# Patient Record
Sex: Male | Born: 1995 | Race: Black or African American | Hispanic: No | Marital: Single | Smoking: Never smoker
Health system: Southern US, Community
[De-identification: ages and names within clinical notes are randomized; demographics above are authoritative.]

---
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
## 2013-05-04 ENCOUNTER — Emergency Department (HOSPITAL_COMMUNITY)
Admission: EM | Admit: 2013-05-04 | Discharge: 2013-05-04 | Disposition: A | Discharge status: Home / Self Care | Payer: No Typology Code available for payment source | Attending: Emergency Medicine | Admitting: Emergency Medicine

## 2013-05-04 ENCOUNTER — Emergency Department (HOSPITAL_COMMUNITY): Payer: No Typology Code available for payment source

## 2013-05-04 DIAGNOSIS — T22219A Burn of second degree of unspecified forearm, initial encounter: Secondary | ICD-10-CM | POA: Insufficient documentation

## 2013-05-04 DIAGNOSIS — Y929 Unspecified place or not applicable: Secondary | ICD-10-CM | POA: Insufficient documentation

## 2013-05-04 DIAGNOSIS — X038XXA Other exposure to controlled fire, not in building or structure, initial encounter: Secondary | ICD-10-CM | POA: Insufficient documentation

## 2013-05-04 DIAGNOSIS — T22211A Burn of second degree of right forearm, initial encounter: Secondary | ICD-10-CM

## 2013-05-04 DIAGNOSIS — Y9389 Activity, other specified: Secondary | ICD-10-CM | POA: Insufficient documentation

## 2013-05-04 MED ORDER — SILVER SULFADIAZINE 1 % EX CREA
TOPICAL_CREAM | Freq: Every day | CUTANEOUS | Status: DC
  Administered 2013-05-04: 16:00:00 via TOPICAL
  Filled 2013-05-04: qty 50

## 2013-05-04 NOTE — ED Notes (Signed)
Pt verbalizes understanding 
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
## 2013-05-05 NOTE — ED Provider Notes (Signed)
Medical screening examination/treatment/procedure(s) were performed by non-physician practitioner and as supervising physician I was immediately available for consultation/collaboration.    Koltin Wehmeyer J. Brunilda Eble, MD 05/05/13 1708 

## 2014-06-06 ENCOUNTER — Encounter (HOSPITAL_COMMUNITY): Payer: Self-pay | Admitting: Emergency Medicine

## 2014-06-06 ENCOUNTER — Emergency Department (HOSPITAL_COMMUNITY)
Admission: EM | Admit: 2014-06-06 | Discharge: 2014-06-06 | Disposition: A | Discharge status: Home / Self Care | Payer: Medicaid - Out of State | Attending: Dermatology | Admitting: Dermatology

## 2014-06-06 ENCOUNTER — Emergency Department (HOSPITAL_COMMUNITY): Payer: Medicaid - Out of State

## 2014-06-06 DIAGNOSIS — Y9361 Activity, american tackle football: Secondary | ICD-10-CM | POA: Diagnosis not present

## 2014-06-06 DIAGNOSIS — W2189XA Striking against or struck by other sports equipment, initial encounter: Secondary | ICD-10-CM | POA: Insufficient documentation

## 2014-06-06 DIAGNOSIS — N50811 Right testicular pain: Secondary | ICD-10-CM

## 2014-06-06 DIAGNOSIS — Y92321 Football field as the place of occurrence of the external cause: Secondary | ICD-10-CM | POA: Insufficient documentation

## 2014-06-06 DIAGNOSIS — S3994XA Unspecified injury of external genitals, initial encounter: Secondary | ICD-10-CM | POA: Insufficient documentation

## 2014-06-06 DIAGNOSIS — N451 Epididymitis: Secondary | ICD-10-CM

## 2014-06-06 MED ORDER — NAPROXEN 500 MG PO TABS: 500.0000 mg | ORAL_TABLET | Freq: Two times a day (BID) | ORAL | Status: AC

## 2014-06-06 NOTE — Discharge Instructions (Signed)
Epididymitis °Epididymitis is a swelling (inflammation) of the epididymis. The epididymis is a cord-like structure along the back part of the testicle. Epididymitis is usually, but not always, caused by infection. This is usually a sudden problem beginning with chills, fever and pain behind the scrotum and in the testicle. There may be swelling and redness of the testicle. °DIAGNOSIS  °Physical examination will reveal a tender, swollen epididymis. Sometimes, cultures are obtained from the urine or from prostate secretions to help find out if there is an infection or if the cause is a different problem. Sometimes, blood work is performed to see if your white blood cell count is elevated and if a germ (bacterial) or viral infection is present. Using this knowledge, an appropriate medicine which kills germs (antibiotic) can be chosen by your caregiver. A viral infection causing epididymitis will most often go away (resolve) without treatment. °HOME CARE INSTRUCTIONS  °· Hot sitz baths for 20 minutes, 4 times per day, may help relieve pain. °· Only take over-the-counter or prescription medicines for pain, discomfort or fever as directed by your caregiver. °· Take all medicines, including antibiotics, as directed. Take the antibiotics for the full prescribed length of time even if you are feeling better. °· It is very important to keep all follow-up appointments. °SEEK IMMEDIATE MEDICAL CARE IF:  °· You have a fever. °· You have pain not relieved with medicines. °· You have any worsening of your problems. °· Your pain seems to come and go. °· You develop pain, redness, and swelling in the scrotum and surrounding areas. °MAKE SURE YOU:  °· Understand these instructions. °· Will watch your condition. °· Will get help right away if you are not doing well or get worse. °Document Released: 08/03/2000 Document Revised: 10/29/2011 Document Reviewed: 06/23/2009 °[employer]® Patient Information ©2015 [employer]. This information  is not intended to replace advice given to you by your health care provider. Make sure you discuss any questions you have with your health care provider. ° °

## 2014-06-06 NOTE — ED Notes (Addendum)
Pt reports he was playing football 2 weeks ago and was hit in his groin, started having right sided scrotal pain. Pain was getting worse daily, pt played football yesterday and reports pain was worse after football. Pain 9/10. Denies dysuria.

## 2014-06-06 NOTE — ED Provider Notes (Signed)
CSN: [id number]     Arrival date & time 06/06/14  0903 History   None    Chief Complaint  Patient presents with  . Groin Pain     (Consider location/radiation/quality/duration/timing/severity/associated sxs/prior Treatment) HPI Eric Knight is a 18 y.o. male is here for evaluation of right-sided scrotal pain. Patient states 2 weeks ago he was playing football and was hit in the testicles and that started the pain. He reports the pain was worse over the next few days but then appeared to get better. He reports playing football again yesterday and the pain returned without any new trauma or injury. He has not tried anything to alleviate pain. No aggravating or relieving factors. Denies fevers, dysuria, penile discharge, new sexual contacts.  History reviewed. No pertinent past medical history. History reviewed. No pertinent past surgical history. History reviewed. No pertinent family history. History  Substance Use Topics  . Smoking status: Never Smoker   . Smokeless tobacco: Not on file  . Alcohol Use: No    Review of Systems  Constitutional: Negative for fever.  Respiratory: Negative for shortness of breath.   Cardiovascular: Negative for chest pain.  Genitourinary: Positive for testicular pain.  Skin: Negative for rash.      Allergies  Review of patient's allergies indicates no known allergies.  Home Medications   Prior to Admission medications   Medication Sig Start Date End Date Taking? Authorizing Provider  naproxen (NAPROSYN) 500 MG tablet Take 1 tablet (500 mg total) by mouth 2 (two) times daily. 06/06/14   Earle GellBenjamin W Sanskriti Greenlaw, PA-C   BP 132/83  Pulse 56  Temp(Src) 97.7 F (36.5 C) (Oral)  Resp 16  SpO2 100% Physical Exam  Nursing note and vitals reviewed. Constitutional:  Awake, alert, nontoxic appearance.  HENT:  Head: Atraumatic.  Eyes: Right eye exhibits no discharge. Left eye exhibits no discharge.  Neck: Neck supple.  Cardiovascular: Normal rate,  regular rhythm and normal heart sounds.   Pulmonary/Chest: Effort normal. He exhibits no tenderness.  Abdominal: Soft. There is no tenderness. There is no rebound.  Genitourinary:  Tenderness to posterior right testicle along vas deferens. No overt tenderness to epididymis or testicle. No evidence of orchitis. No overt erythema, warmth, edema or other deformity noted. Testicles appear to have equal lie. No high riding testicle  Musculoskeletal: He exhibits no tenderness.  Baseline ROM, no obvious new focal weakness.  Neurological:  Mental status and motor strength appears baseline for patient and situation.  Skin: No rash noted.  Psychiatric: He has a normal mood and affect.    ED Course  Procedures (including critical care time) Labs Review Labs Reviewed - No data to display  Imaging Review [place] Scrotum  06/06/2014   CLINICAL DATA:  Right scrotal pain for the past 2 weeks.  EXAM: SCROTAL ULTRASOUND  DOPPLER ULTRASOUND OF THE TESTICLES  TECHNIQUE: Complete ultrasound examination of the testicles, epididymis, and other scrotal structures was performed. Color and spectral Doppler ultrasound were also utilized to evaluate blood flow to the testicles.  COMPARISON:  None.  FINDINGS: Right testicle  Measurements: 4.8 x 2.7 2.5 cm. No mass or microlithiasis visualized.  Left testicle  Measurements: 4.4 x 3.1 x 3.0 cm. No mass or microlithiasis visualized.  Right epididymis: Normal appearing epididymal head. The tail is prominent, heterogeneous and has increased vascularity.  Left epididymis: 2 mm cyst in the head of the epididymis. Otherwise, normal appearance.  Hydrocele:  Small bilateral  Varicocele:  Right-sided.  Pulsed Doppler interrogation of  both testes demonstrates low resistance arterial and venous waveforms bilaterally.  IMPRESSION: 1. Findings compatible with epididymitis involving the tail of the epididymis on the right. 2. Right varicocele. 3. Small bilateral hydroceles.   Electronically  Signed   By: Gordan PaymentSteve  Reid M.D.   On: 06/06/2014 11:29   [place] Art/ven Flow Abd Pelv Doppler  06/06/2014   CLINICAL DATA:  Right scrotal pain for the past 2 weeks.  EXAM: SCROTAL ULTRASOUND  DOPPLER ULTRASOUND OF THE TESTICLES  TECHNIQUE: Complete ultrasound examination of the testicles, epididymis, and other scrotal structures was performed. Color and spectral Doppler ultrasound were also utilized to evaluate blood flow to the testicles.  COMPARISON:  None.  FINDINGS: Right testicle  Measurements: 4.8 x 2.7 2.5 cm. No mass or microlithiasis visualized.  Left testicle  Measurements: 4.4 x 3.1 x 3.0 cm. No mass or microlithiasis visualized.  Right epididymis: Normal appearing epididymal head. The tail is prominent, heterogeneous and has increased vascularity.  Left epididymis: 2 mm cyst in the head of the epididymis. Otherwise, normal appearance.  Hydrocele:  Small bilateral  Varicocele:  Right-sided.  Pulsed Doppler interrogation of both testes demonstrates low resistance arterial and venous waveforms bilaterally.  IMPRESSION: 1. Findings compatible with epididymitis involving the tail of the epididymis on the right. 2. Right varicocele. 3. Small bilateral hydroceles.   Electronically Signed   By: Gordan PaymentSteve  Reid M.D.   On: 06/06/2014 11:29     EKG Interpretation None      MDM  Vitals stable - WNL -afebrile Pt resting comfortably in ED. PE Not concerning for other acute or emergent pathology. Low suspicion for Torsion, cellulitis or other acute scrotum. Epididymitis likely traumatic in nature without associated STI. Will treat conservatively with NSAIDS, elevation/supportive briefs, and rest.  Will DC with Naprosyn for inflammation and pain management. Discussed f/u with PCP and return precautions, pt very amenable to plan. Pt stable in good condition and is appropriate for DC.   Final diagnoses:  Pain in right testicle  Epididymitis        Sharlene MottsBenjamin W Havah Ammon, PA-C 06/07/14 1149

## 2014-06-06 NOTE — ED Notes (Signed)
Pt transported to [facility]

## 2014-06-08 NOTE — ED Provider Notes (Signed)
Medical screening examination/treatment/procedure(s) were performed by non-physician practitioner and as supervising physician I was immediately available for consultation/collaboration.     Suzi RootsKevin E Tahjay Binion, MD 06/08/14 [phone]

## 2014-10-29 ENCOUNTER — Emergency Department (HOSPITAL_COMMUNITY)
Admission: EM | Admit: 2014-10-29 | Discharge: 2014-10-29 | Disposition: A | Discharge status: Home / Self Care | Payer: PRIVATE HEALTH INSURANCE | Attending: Emergency Medicine | Admitting: Emergency Medicine

## 2014-10-29 ENCOUNTER — Encounter (HOSPITAL_COMMUNITY): Payer: Self-pay | Admitting: Emergency Medicine

## 2014-10-29 DIAGNOSIS — N508 Other specified disorders of male genital organs: Secondary | ICD-10-CM | POA: Insufficient documentation

## 2014-10-29 DIAGNOSIS — R590 Localized enlarged lymph nodes: Secondary | ICD-10-CM | POA: Insufficient documentation

## 2014-10-29 DIAGNOSIS — Z791 Long term (current) use of non-steroidal anti-inflammatories (NSAID): Secondary | ICD-10-CM | POA: Insufficient documentation

## 2014-10-29 LAB — URINALYSIS, ROUTINE W REFLEX MICROSCOPIC
BILIRUBIN URINE: NEGATIVE
Glucose, UA: NEGATIVE mg/dL
HGB URINE DIPSTICK: NEGATIVE
KETONES UR: NEGATIVE mg/dL
Nitrite: NEGATIVE
PH: 5.5 (ref 5.0–8.0)
Protein, ur: NEGATIVE mg/dL
SPECIFIC GRAVITY, URINE: 1.016 (ref 1.005–1.030)
Urobilinogen, UA: 0.2 mg/dL (ref 0.0–1.0)

## 2014-10-29 LAB — URINE MICROSCOPIC-ADD ON

## 2014-10-29 MED ORDER — DOXYCYCLINE HYCLATE 100 MG PO TABS
100.0000 mg | ORAL_TABLET | Freq: Once | ORAL | Status: AC
  Administered 2014-10-29: 100 mg via ORAL
  Filled 2014-10-29: qty 1

## 2014-10-29 MED ORDER — LIDOCAINE HCL 1 % IJ SOLN
INTRAMUSCULAR | Status: AC
  Administered 2014-10-29: 0.1 mL
  Filled 2014-10-29: qty 20

## 2014-10-29 MED ORDER — LIDOCAINE HCL (PF) 1 % IJ SOLN
INTRAMUSCULAR | Status: AC
  Filled 2014-10-29: qty 5

## 2014-10-29 MED ORDER — DOXYCYCLINE HYCLATE 100 MG PO TABS: 100.0000 mg | ORAL_TABLET | Freq: Two times a day (BID) | ORAL | Status: AC

## 2014-10-29 MED ORDER — IBUPROFEN 800 MG PO TABS: 800.0000 mg | ORAL_TABLET | Freq: Three times a day (TID) | ORAL | Status: AC

## 2014-10-29 MED ORDER — CEFTRIAXONE SODIUM 250 MG IJ SOLR
250.0000 mg | Freq: Once | INTRAMUSCULAR | Status: AC
  Administered 2014-10-29: 250 mg via INTRAMUSCULAR
  Filled 2014-10-29: qty 250

## 2014-10-29 NOTE — Discharge Instructions (Signed)
Lymphadenopathy °Lymphadenopathy means "disease of the lymph glands." But the term is usually used to describe swollen or enlarged lymph glands, also called lymph nodes. These are the bean-shaped organs found in many locations including the neck, underarm, and groin. Lymph glands are part of the immune system, which fights infections in your body. Lymphadenopathy can occur in just one area of the body, such as the neck, or it can be generalized, with lymph node enlargement in several areas. The nodes found in the neck are the most common sites of lymphadenopathy. °CAUSES °When your immune system responds to germs (such as viruses or bacteria ), infection-fighting cells and fluid build up. This causes the glands to grow in size. Usually, this is not something to worry about. Sometimes, the glands themselves can become infected and inflamed. This is called lymphadenitis. °Enlarged lymph nodes can be caused by many diseases: °· Bacterial disease, such as strep throat or a skin infection. °· Viral disease, such as a common cold. °· Other germs, such as Lyme disease, tuberculosis, or sexually transmitted diseases. °· Cancers, such as lymphoma (cancer of the lymphatic system) or leukemia (cancer of the white blood cells). °· Inflammatory diseases such as lupus or rheumatoid arthritis. °· Reactions to medications. °Many of the diseases above are rare, but important. This is why you should see your caregiver if you have lymphadenopathy. °SYMPTOMS °· Swollen, enlarged lumps in the neck, back of the head, or other locations. °· Tenderness. °· Warmth or redness of the skin over the lymph nodes. °· Fever. °DIAGNOSIS °Enlarged lymph nodes are often near the source of infection. They can help health care providers diagnose your illness. For instance: °· Swollen lymph nodes around the jaw might be caused by an infection in the mouth. °· Enlarged glands in the neck often signal a throat infection. °· Lymph nodes that are swollen in  more than one area often indicate an illness caused by a virus. °Your caregiver will likely know what is causing your lymphadenopathy after listening to your history and examining you. Blood tests, x-rays, or other tests may be needed. If the cause of the enlarged lymph node cannot be found, and it does not go away by itself, then a biopsy may be needed. Your caregiver will discuss this with you. °TREATMENT °Treatment for your enlarged lymph nodes will depend on the cause. Many times the nodes will shrink to normal size by themselves, with no treatment. Antibiotics or other medicines may be needed for infection. Only take over-the-counter or prescription medicines for pain, discomfort, or fever as directed by your caregiver. °HOME CARE INSTRUCTIONS °Swollen lymph glands usually return to normal when the underlying medical condition goes away. If they persist, contact your health-care provider. He/she might prescribe antibiotics or other treatments, depending on the diagnosis. Take any medications exactly as prescribed. Keep any follow-up appointments made to check on the condition of your enlarged nodes. °SEEK MEDICAL CARE IF: °· Swelling lasts for more than two weeks. °· You have symptoms such as weight loss, night sweats, fatigue, or fever that does not go away. °· The lymph nodes are hard, seem fixed to the skin, or are growing rapidly. °· Skin over the lymph nodes is red and inflamed. This could mean there is an infection. °SEEK IMMEDIATE MEDICAL CARE IF: °· Fluid starts leaking from the area of the enlarged lymph node. °· You develop a fever of 102° F (38.9° C) or greater. °· Severe pain develops (not necessarily at the site of a   large lymph node). °· You develop chest pain or shortness of breath. °· You develop worsening abdominal pain. °MAKE SURE YOU: °· Understand these instructions. °· Will watch your condition. °· Will get help right away if you are not doing well or get worse. °Document Released:  05/15/2008 Document Revised: 12/21/2013 Document Reviewed: 05/15/2008 °ExitCare® Patient Information ©2015 [employer]. This information is not intended to replace advice given to you by your health care provider. Make sure you discuss any questions you have with your health care provider. ° ° °Emergency Department Resource Guide °1) Find a Doctor and Pay Out of Pocket °Although you won't have to find out who is covered by your insurance plan, it is a good idea to ask around and get recommendations. You will then need to call the office and see if the doctor you have chosen will accept you as a new patient and what types of options they offer for patients who are self-pay. Some doctors offer discounts or will set up payment plans for their patients who do not have insurance, but you will need to ask so you aren't surprised when you get to your appointment. ° °2) Contact Your Local Health Department °Not all health departments have doctors that can see patients for sick visits, but many do, so it is worth a call to see if yours does. If you don't know where your local health department is, you can check in your phone book. The CDC also has a tool to help you locate your state's health department, and many state websites also have listings of all of their local health departments. ° °3) Find a Walk-in Clinic °If your illness is not likely to be very severe or complicated, you may want to try a walk in clinic. These are popping up all over the country in pharmacies, drugstores, and shopping centers. They're usually staffed by nurse practitioners or physician assistants that have been trained to treat common illnesses and complaints. They're usually fairly quick and inexpensive. However, if you have serious medical issues or chronic medical problems, these are probably not your best option. ° °No Primary Care Doctor: °- Call [facility] at  832-8000 - they can help you locate a primary care doctor that  accepts  your insurance, provides certain services, etc. °- Physician Referral Service- 1-800-533-3463 ° °Chronic Pain Problems: °Organization         Address  Phone   Notes  °[facility]  (336) 297-2271 Patients need to be referred by their primary care doctor.  ° °Medication Assistance: °Organization         Address  Phone   Notes  °[place] [facility] [address] °[place], [place] [address] (336) 641-8030 --Must be a resident of [place] °-- Must have NO insurance coverage whatsoever (no Medicaid/ Medicare, etc.) °-- The pt. MUST have a primary care doctor that directs their care regularly and follows them in the community °  °[facility]  (866) 331-1348   °[employer]  (888) 892-1162   ° °Agencies that provide inexpensive medical care: °Organization         Address  Phone   Notes  °[facility]  (336) 832-8035   °[facility]    (336) 832-7272   °[facility] [address] °[place], [place] [address] (336) 832-4777   °[facility] [address], °[place] (336) 271-4999   °[facility]    (  336) 373-0678   °[facility]    (336) 272-1050   °[facility] ° [address], [place] Phone:  (336) 832-4444, Fax:  (336) 832-4440 Hours of Operation:  9 am - 6 pm, M-F.  Also accepts Medicaid/Medicare and self-pay.  °[facility] ° [address], [place] Phone: (336) 832-3150, Fax: (336) 832-3151. Hours of Operation:  8:30 am - 5:30 pm, M-F.  Also accepts Medicaid and self-pay.  °[facility] [place] [address], [place] Phone: (336) 878-6027   °[facility] [address], [place], [place] (336)723-1848, Ext. 123 Mondays & Thursdays: 7-9 AM.  First 15 patients are seen on a first come, first serve basis. °  ° °Medicaid-accepting [place] Providers: ° °Organization          Address  Phone   Notes  °[facility] [address], [place] (336) 641-2100 Also accepts self-pay patients.  °[facility] [address], [place] ° (336) 856-9996   °[facility] [address], [place] (336) 288-8857   °[facility] [address] [place] [address], [place] (336) 299-7000   °Veita Bland [address], [place]  ° (336) 373-1557 Only accepts [place] Access Medicaid patients after they have their name applied to their card.  ° °Self-Pay (no insurance) in [place]: ° °Organization         Address  Phone   Notes  °Sickle Cell Patients, [facility] [address], [place] (336) 832-1970   °[facility] [address], [place] (336) 832-4400   ° [facility] ° [address], [place] (336) 992-4800   °[facility]/Dr. Osei-Bonsu ° [address] [place] [address], [place] or [address], [place] (336) 841-8500 Phone number for both [place] and [place] locations is the same.  °[facility] [address], [place] (336) 299-0000   °[facility] [address] [place] [address], [place] or [address] (336) 852-7530 °(336) 878-2260   °[facility] [address], [place] (336) 350-1642, phone; (336) 294-5005, fax Sees patients 1st and 3rd Saturday of every month.  Must not qualify for public or private insurance (i.e. Medicaid, Medicare, [place] Health Choice, Veterans' Benefits) • Household income should be no more than 200% of the poverty level •The clinic cannot treat you if you are pregnant or think you are pregnant • Sexually transmitted diseases are not treated at the clinic.  ° ° °Dental Care: °Organization         Address  Phone  Notes  °[facility] [address],  [place] (336) 641-6152 Accepts children up to age 21 who are enrolled in Medicaid or [place] Health Choice; pregnant women with a Medicaid card; and children who have applied for Medicaid or [place] Health Choice, but were declined, whose parents can pay a reduced fee at time of service.  °[facility] [place]  [address], [place] (336) 641-7733 Accepts children up to age 21 who are enrolled in Medicaid or [place] Health Choice; pregnant women with a Medicaid card; and children who have applied for Medicaid or [place] Health Choice, but were declined, whose parents can pay a   reduced fee at time of service.  °[facility] ° [address], [place] (336) 641-4533 Patients are seen by appointment only. Walk-ins are not accepted. [facility] will see patients 18 years of age and older. °Monday - Tuesday (8am-5pm) °Most Wednesdays (8:30-5pm) °$30 per visit, cash only  °[facility] ° [address], [place] (336) 641-4533 Patients are seen by appointment only. Walk-ins are not accepted. [facility] will see patients 18 years of age and older. °One Wednesday Evening (Monthly: Volunteer Based).  $30 per visit, cash only  °[facility]  (919) 537-3737 for adults; Children under age 4, call Graduate Pediatric Dentistry at ([phone]) [phone]. Children aged 4-14, please call (919) [phone] to request a pediatric application. ° Dental services are provided in all areas of dental care including fillings, crowns and bridges, complete and partial dentures, implants, gum treatment, root canals, and extractions. Preventive care is also provided. Treatment is provided to both adults and children. °Patients are selected via a lottery and there is often a waiting list. °  °[facility] [address], °[place] ° (336) 763-8833 [email or web address] °  °[facility] [address], [place], [place]  (336)723-1848, Ext. 123 Second and Fourth Thursday of each month, opens at 6:30 AM; Clinic ends at 9 AM.  Patients are seen on a first-come first-served basis, and a limited number are seen during each clinic.  ° °[facility] ° [address], [place], [place] (336) 723-7904   Eligibility Requirements °You must have lived in [place], [place], or [place] counties for at least the last three months. °  You cannot be eligible for state or federal sponsored healthcare insurance, including Veterans Administration, Medicaid, or Medicare. °  You generally cannot be eligible for healthcare insurance through your employer.  °  How to apply: °Eligibility screenings are held every Tuesday and Wednesday afternoon from 1:00 pm until 4:00 pm. You do not need an appointment for the interview!  °[facility] [address], [place], [place] 336-631-2330   °[facility]  336-342-8273   °[facility]  336-703-3100   °[facility]  336-570-6415   ° °Behavioral Health Resources in the Community: °Intensive Outpatient Programs °Organization         Address  Phone  Notes  °[facility] [address], [place], [place] 336-878-6098   °[facility] [address], [place], [place] 336-832-9800   °[facility] [address], [place], [place] ° 336-882-2125   °[facility] [address],  °[place], [place] 1-800-853-5163 or 336-641-4981   °Substance Abuse Resources °Organization         Address  Phone  Notes  °[facility]  336-882-2125   °[facility]  336-784-9470   °[facility]  336-285-9073   °[facility]  336-845-3988   °Residential & Outpatient Substance Abuse Program  1-800-659-3381   °Psychological Services °Organization         Address  Phone  Notes  °[facility]  336- 832-9600   °[facility]  336- 378-7881    °[facility] [address], [place] 1-800-853-5163 or 336-641-4981   ° °Mobile Crisis Teams °Organization         Address  Phone  Notes  °[facility], Mobile Crisis Care Unit  1-877-626-1772   °  Assertive °[facility] ° [address] [place], [place] 336-834-9664   °Sharon DeEsch [address] °[place] 336-554-5454   ° °Self-Help/Support Groups °Organization         Address  Phone             Notes  °[facility] - variety of support groups  336- 373-1402 Call for more information  °Narcotics Anonymous (NA), [facility] [address], °[place]  2 meetings at this location  ° °Residential Treatment Programs °Organization         Address  Phone  Notes  °[facility] [address],    °[place]  1-866-801-8205   °[facility] ° [address], [place], [place] 704-293-8524   °[facility] [address], [place] 336-845-3988 Admissions: 8am-3pm M-F  °[facility] [address]    °[place], [place] 336-841-1104   °[facility] [address], [place], [place] 336-379-7146   °[facility] [address]  °[place], [place] 336-285-9073   °[facility] - Intensive Outpatient [address], [place], [place] 336-852-3033   °[facility] [address]  °[place], [place] 1-877-615-2722 or 336-784-9470   °[facility] [address] [place], [place] 336-227-7417 Accepts Medicaid  °[facility] [address]  °[place] 1-800-659-3381 Substance Abuse/Addiction Treatment  ° °[place] Behavioral Health Resources °Organization         Address  Phone  Notes  °[facility]  (888) 581-9988   °Julie Brannon, PhD [address] [place], [place]   (336) 349-5553 or (336) 951-0000   °[facility]   [address] °[place], [place] (336) 349-4454   °[facility] [address], [place], [place] (336) 342-8316 Insurance/Medicaid/sponsorship through [facility]  °[facility] [address]                                    [place],  (336) 342-8316 Therapy/tele-psych/case  °[facility] [address]  ° [place],  (336) 349-2233    °Dr. Arfeen  (336) 349-4544   °[facility] [place]  [employer] [place] [facility] 1) [address], [place] °2) [address], [place] °3)  [address], [place] (336) 349-3220 °(336) 342-7768 ° °(336) 342-8140   °[place] [facility] (336) 342-1394 or (336) 342-3537 (After Hours)    ° ° ° °

## 2014-10-29 NOTE — ED Provider Notes (Signed)
CSN: [id number]     Arrival date & time 10/29/14  1828 History   First MD Initiated Contact with Patient 10/29/14 2216     Chief Complaint  Patient presents with  . Groin Pain     (Consider location/radiation/quality/duration/timing/severity/associated sxs/prior Treatment) HPI The patient poor she's had pain in his right groin for about 2 weeks. He denies any pain or burning with urination. He reports she's been playing football also is not sure if he injured it due to that activity. He denies a pain is made worse by activity or movement. It waxes and wanes in severity. History reviewed. No pertinent past medical history. History reviewed. No pertinent past surgical history. No family history on file. History  Substance Use Topics  . Smoking status: Never Smoker   . Smokeless tobacco: Not on file  . Alcohol Use: No    Review of Systems 10 Systems reviewed and are negative for acute change except as noted in the HPI.    Allergies  Review of patient's allergies indicates no known allergies.  Home Medications   Prior to Admission medications   Medication Sig Start Date End Date Taking? Authorizing Provider  doxycycline (VIBRA-TABS) 100 MG tablet Take 1 tablet (100 mg total) by mouth 2 (two) times daily. 10/29/14   Arby Barrette, MD  ibuprofen (ADVIL,MOTRIN) 800 MG tablet Take 1 tablet (800 mg total) by mouth 3 (three) times daily. 10/29/14   Arby Barrette, MD  naproxen (NAPROSYN) 500 MG tablet Take 1 tablet (500 mg total) by mouth 2 (two) times daily. Patient not taking: Reported on 10/29/2014 06/06/14   Joycie Peek, PA-C   BP 140/78 mmHg  Pulse 49  Temp(Src) 98.2 F (36.8 C) (Oral)  Resp 12  Ht 5' 10.5" (1.791 m)  Wt 180 lb (81.647 kg)  BMI 25.45 kg/m2  SpO2 100% Physical Exam  Constitutional: He is oriented to person, place, and time. He appears well-developed and well-nourished. No distress.  HENT:  Head: Normocephalic and atraumatic.  Pulmonary/Chest: Effort  normal.  Abdominal: Soft. Bowel sounds are normal. He exhibits no distension and no mass. There is no tenderness. There is no rebound and no guarding.  Genitourinary:  The patient has an isolated approximately 1 cm lymph node in the right groin. It is smooth and mobile. There is no matting of the nodes. It is mildly tender. The patient has a small lesion on the shaft of the penis that is dry and only a few millimeters and appears to be healing with a thin scab over it. It is not vesicular or ulcerated in nature. The patient's testicles are smooth and nontender. There is no scrotal swelling.  Musculoskeletal: Normal range of motion. He exhibits no edema or tenderness.  Neurological: He is alert and oriented to person, place, and time. Coordination normal.  Skin: Skin is warm and dry.  Psychiatric: He has a normal mood and affect.    ED Course  Procedures (including critical care time) Labs Review Labs Reviewed  URINALYSIS, ROUTINE W REFLEX MICROSCOPIC - Abnormal; Notable for the following:    Leukocytes, UA SMALL (*)    All other components within normal limits  URINE MICROSCOPIC-ADD ON  GC/CHLAMYDIA PROBE AMP (Concrete)    Imaging Review No results found.   EKG Interpretation None      MDM   Final diagnoses:  Lymphadenopathy, inguinal   Patient has isolated inguinal lymphadenopathy. He denies penile drainage or discharge. There is no testicular pain associated. The patient will empirically  be treated with Rocephin and doxycycline. He is otherwise well in appearance without any signs of toxicity.    Arby BarretteMarcy Yalexa Blust, MD 10/29/14 [phone]

## 2014-10-29 NOTE — ED Notes (Addendum)
Pt c/o right groin pain that has been hurting for over a week. Pt states that he playing football so he doesn't know if he hurt it while playing.  Pt denies any urinary problems. Pt state that he believes it is swollen some

## 2014-11-01 LAB — GC/CHLAMYDIA PROBE AMP (~~LOC~~) NOT AT ARMC
Chlamydia: POSITIVE — AB
Neisseria Gonorrhea: NEGATIVE

## 2014-11-07 ENCOUNTER — Telehealth (HOSPITAL_COMMUNITY): Payer: Self-pay

## 2014-11-07 NOTE — Telephone Encounter (Signed)
[facility]  (+) Chlamydia. Treated with Rocephin and given Rx for Doxycycline .  [employer]HS form completed and faxed.  Call and notify patient.

## 2014-11-08 ENCOUNTER — Telehealth (HOSPITAL_COMMUNITY): Payer: Self-pay

## 2014-11-08 NOTE — ED Notes (Signed)
Pt treated per protocol. Attempted call. Letter sent to address on record

## 2014-11-16 ENCOUNTER — Telehealth (HOSPITAL_COMMUNITY): Payer: Self-pay

## 2014-11-16 NOTE — ED Notes (Signed)
Unable to contact pt by mail or telephone. Unable to communicate lab results or treatment changes. 

## 2015-12-18 IMAGING — US US SCROTUM
1 series · 14 of 25 positions shown · non-contrast
Comparison: None.

CLINICAL DATA: Right scrotal pain for the past 2 weeks.

EXAM:
SCROTAL ULTRASOUND
DOPPLER ULTRASOUND OF THE TESTICLES
TECHNIQUE: Complete ultrasound examination of the testicles, epididymis, and
other scrotal structures was performed. Color and spectral Doppler
ultrasound were also utilized to evaluate blood flow to the
testicles.

[Series 1: us scrotum · 0.05mm/px · 71 acquisitions, 14 frames shown]
[im 1/71]
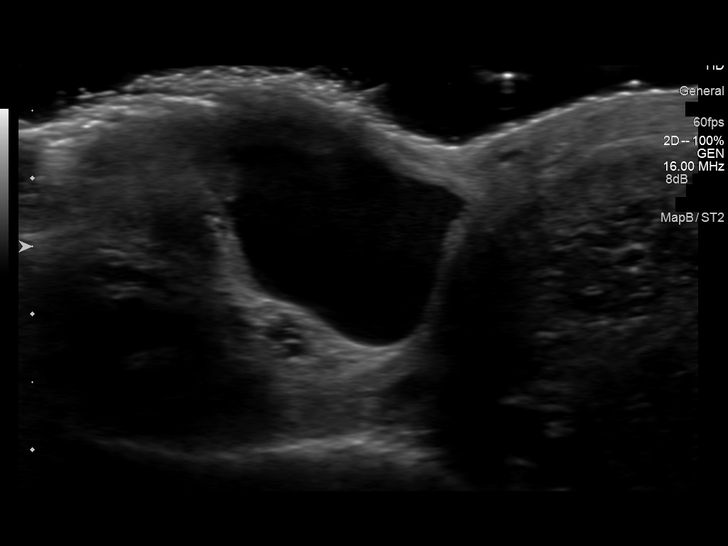
[im 6/71]
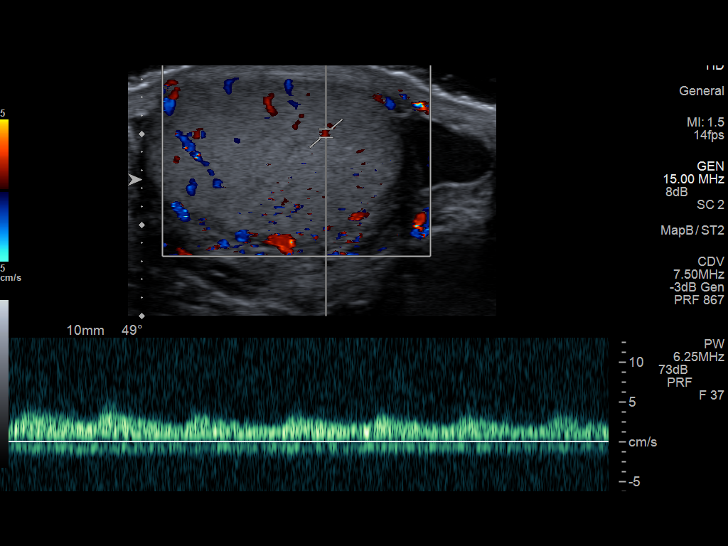
[im 12/71]
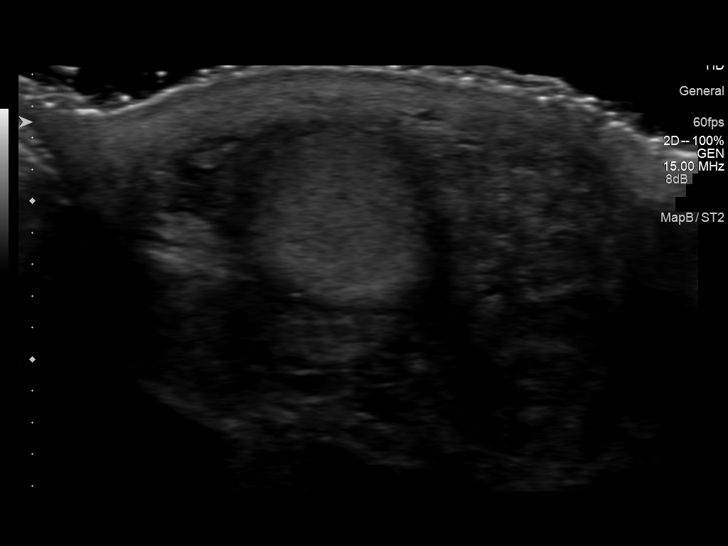
[im 18/71]
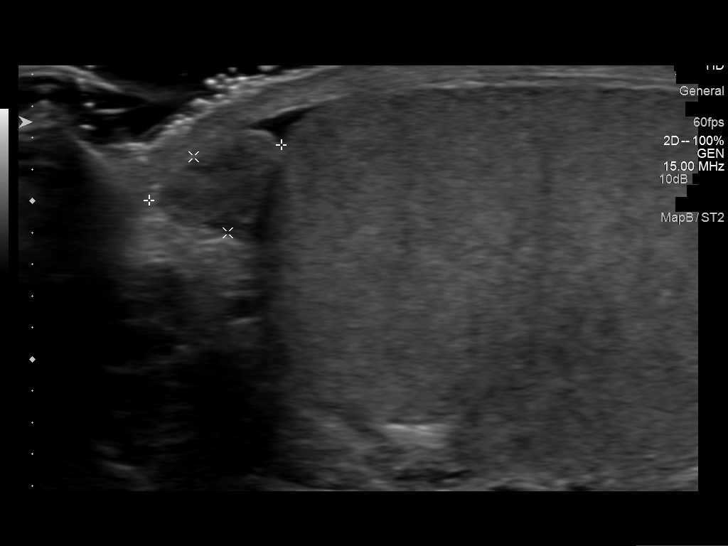
[im 24/71]
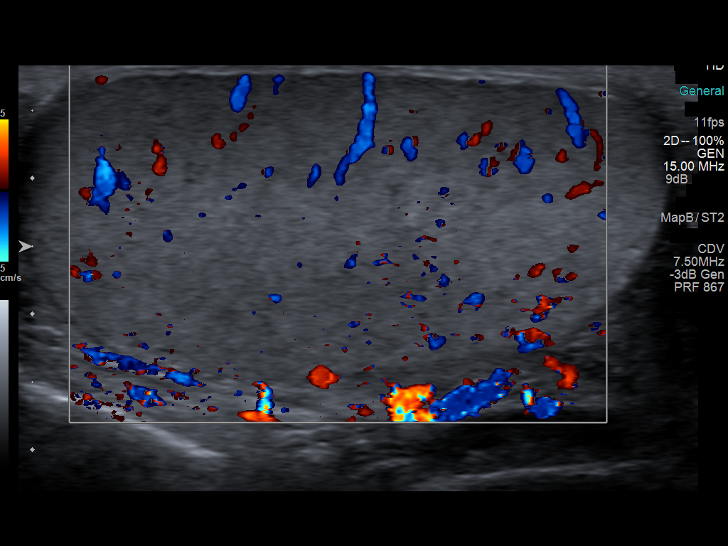
[im 27/71]
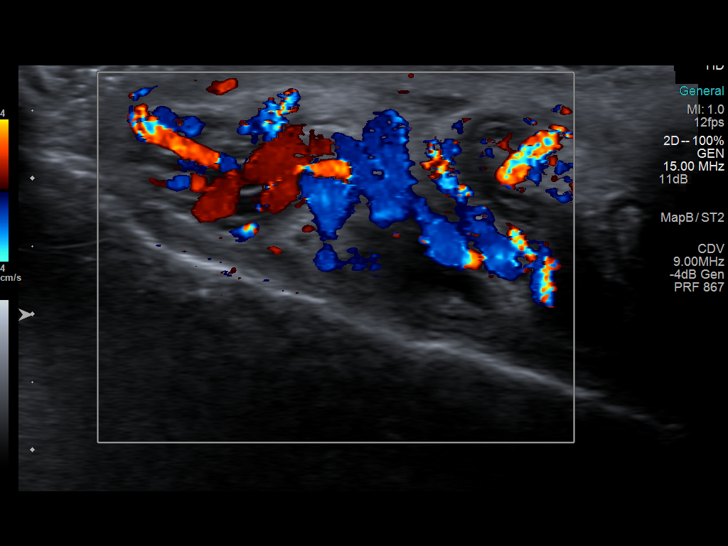
[im 33/71]
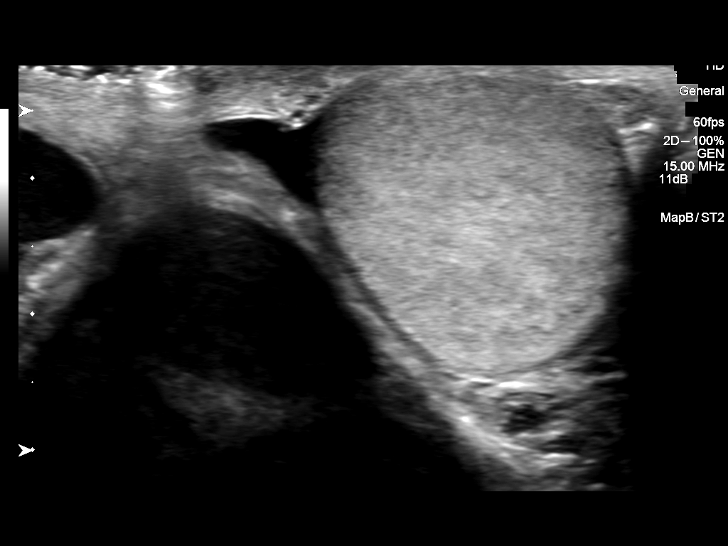
[im 38/71]
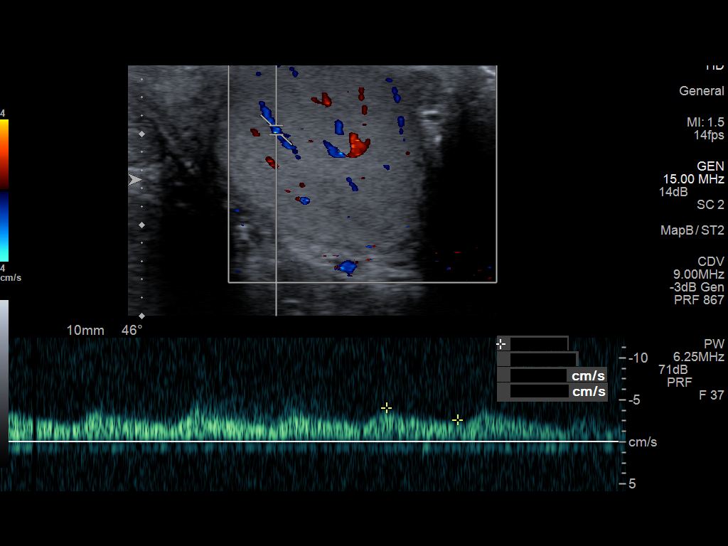
[im 44/71]
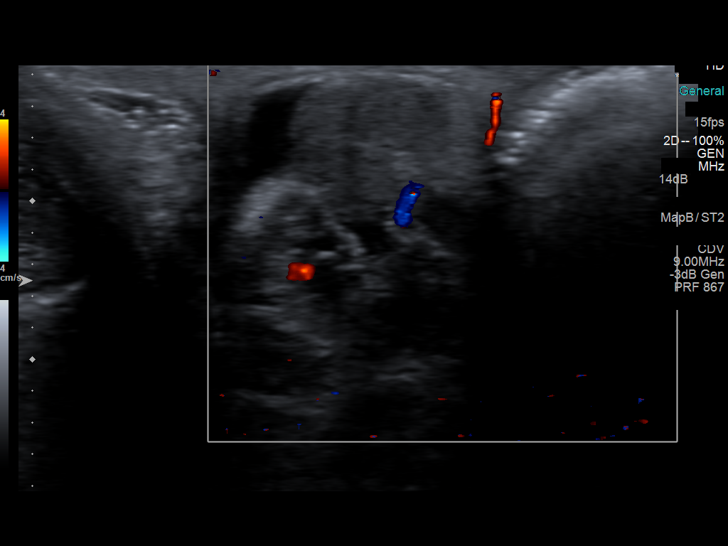
[im 47/71]
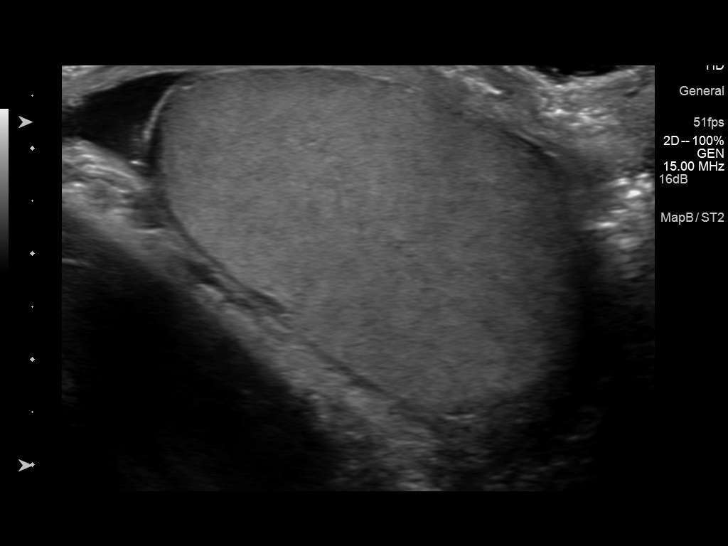
[im 53/71]
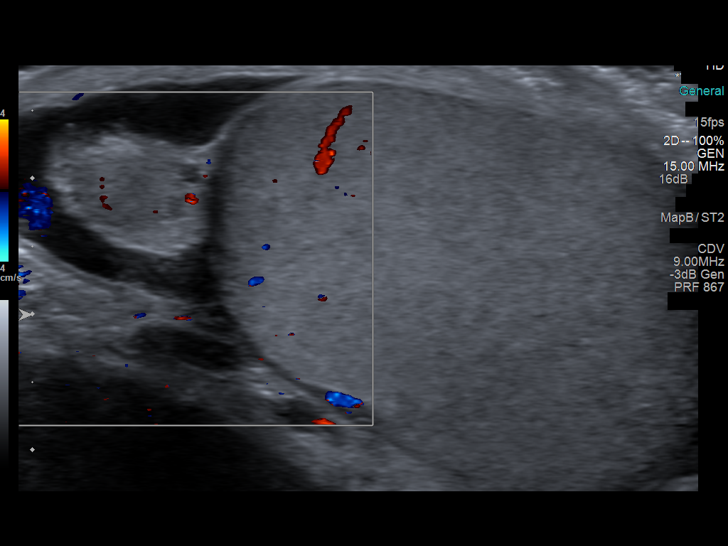
[im 59/71]
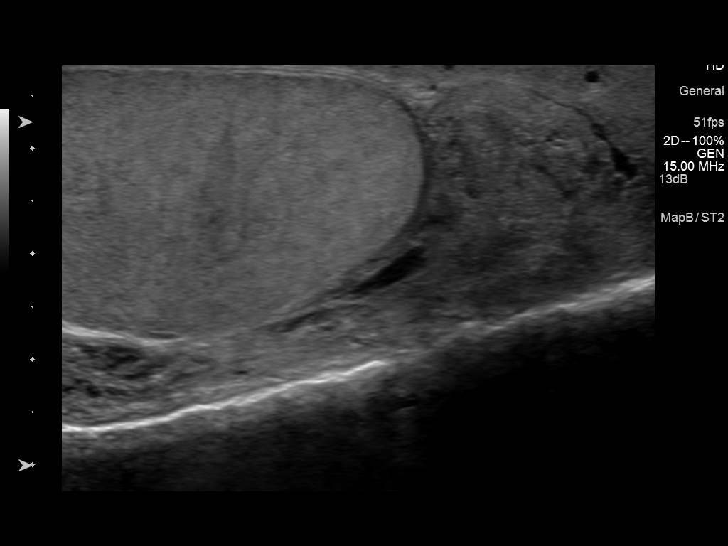
[im 65/71]
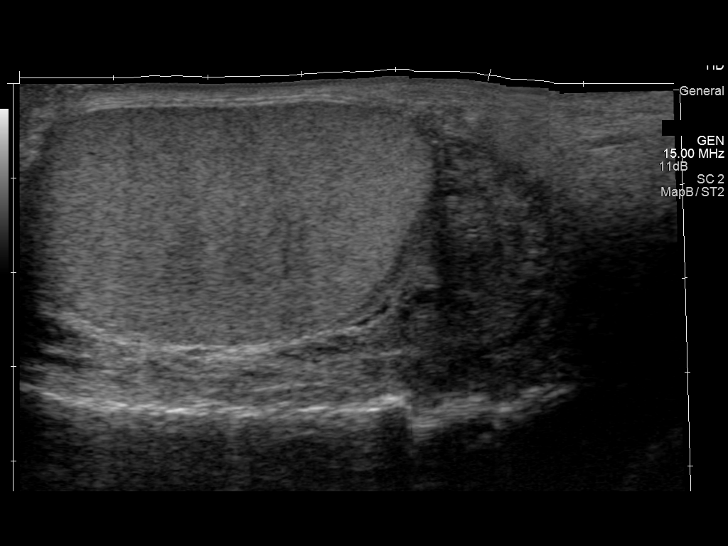
[im 71/71]
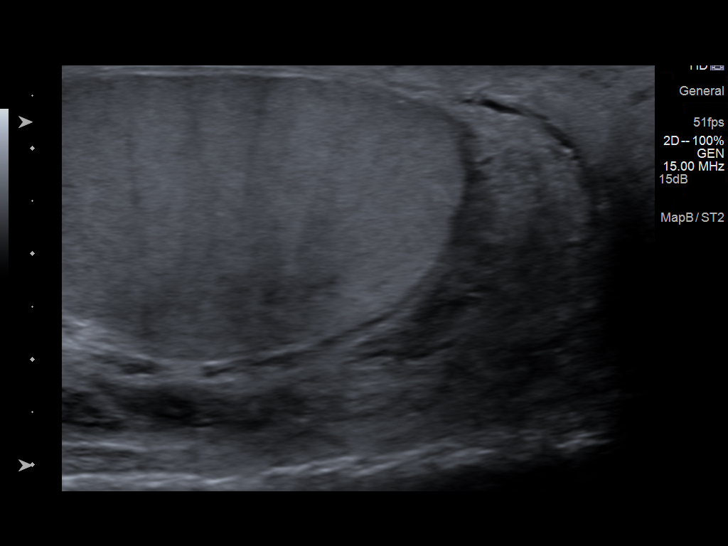

[14 of 25 positions shown; findings below may reference images not displayed]

FINDINGS: Right testicle

Measurements: 4.8 x 2.7 2.5 cm. No mass or microlithiasis
visualized.

Left testicle

Measurements: 4.4 x 3.1 x 3.0 cm. No mass or microlithiasis
visualized.

Right epididymis: Normal appearing epididymal head. The tail is
prominent, heterogeneous and has increased vascularity.

Left epididymis: 2 mm cyst in the head of the epididymis. Otherwise,
normal appearance.

Hydrocele:  Small bilateral

Varicocele:  Right-sided.

Pulsed Doppler interrogation of both testes demonstrates low
resistance arterial and venous waveforms bilaterally.
IMPRESSION: 1. Findings compatible with epididymitis involving the tail of the
epididymis on the right.
2. Right varicocele.
3. Small bilateral hydroceles.
